# Patient Record
Sex: Male | Born: 1995 | Race: White | Hispanic: No | Marital: Single | Smoking: Current some day smoker
Health system: Southern US, Community
[De-identification: ages and names within clinical notes are randomized; demographics above are authoritative.]

## PROBLEM LIST (undated history)

## (undated) HISTORY — PX: TONSILLECTOMY: SUR1361

---
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
## 2015-03-07 NOTE — H&P (Signed)
PATIENT NAME:  Gabriel Wright, Gabriel Wright MR#:  [id number] DATE OF BIRTH:  [birth year]  DATE OF ADMISSION:  10/04/2014  ADMITTING PHYSICIAN: Enid Baas, MD.    PRIMARY CARE PHYSICIAN: None.   CHIEF COMPLAINT: Difficulty swallowing, sore throat.   HISTORY OF PRESENT ILLNESS: Gabriel Wright is an 19 year old kid with no significant past medical history who presents to the hospital secondary to a 1 day history of fever, severe headache, and also difficulty swallowing. The patient states his symptoms started about 6 days ago and he had low-grade fevers, headaches. Yesterday he went to the health department as he was having a sore throat as well. They checked him out and a mononucleosis test was positive and he was discharged home on prednisone to help with his tonsillar swelling. However, this morning he woke up, had a fever of 101 degrees Fahrenheit,  in the Emergency Room he could not swallow his secretions, his tonsils were extremely big, he was having severe throat pain, he could not take ibuprofen and presented to the ER. CT of the neck showed severe lymphadenopathy, a left tonsillar small abscess which was drained by ENT in the Emergency Room. The patient is being admitted for further observation for IV antibiotics and Decadron to decrease his throat swallowing so he can take medicines by p.o.    PAST MEDICAL HISTORY: None.   PAST SURGICAL HISTORY: None.   ALLERGIES TO MEDICATIONS: No known drug allergies.   CURRENT HOME MEDICATIONS: He is only on prednisone that was started by the health department yesterday, otherwise does not have any prescription or over the counter medications.   SOCIAL HISTORY: Lives at home with parents. No smoking. Occasional alcohol use.   FAMILY HISTORY:  Both parents are healthy. No other significant family history in the family.     REVIEW OF SYSTEMS:  CONSTITUTIONAL: Positive for fatigue, fever.  EYES: No blurred vision, double vision, inflammation or glaucoma.   ENT: No tinnitus, ear pain, hearing loss, epistaxis or discharge. The patient does have some odynophagia and dysphagia. Unable to swallow his secretions from severe throat pain.  RESPIRATORY: No cough, wheeze, hemoptysis or COPD.   CARDIOVASCULAR: No chest pain, arrhythmia, palpitations, or syncope.  GASTROINTESTINAL: No nausea, vomiting, diarrhea, abdominal pain, hematemesis, or melena.  GENITOURINARY: No dysuria, hematochezia, renal calculus, frequency, or incontinence.  ENDOCRINE: No polyuria, nocturia, thyroid problems, heat or cold intolerance.  HEMATOLOGY: No anemia, easy bruising or bleeding.  SKIN: No acne, rash or lesions.  MUSCULOSKELETAL: No neck, back, shoulder pain, arthritis or gout.  NEUROLOGIC: No numbness, weakness, CVA, TIA or seizures.   PSYCHOLOGICAL: No anxiety, insomnia or depression.   PHYSICAL EXAMINATION: VITAL SIGNS: Temperature 101.1 degrees Fahrenheit in the Emergency Room, pulse 114, respirations 18, blood pressure 146/76, pulse oximetry 96% on room air.  GENERAL: Well-built, well-nourished young male sitting in bed, not in any acute distress.  HEENT: Normocephalic, atraumatic. Pupils equal, round, reacting to light. Anicteric sclerae. Extraocular movements intact. There is a birth mark, a big telangiectasia on the left side of the face from the nose onto the cheekbone. Oropharynx: Increased salivation because the patient unable to swallow his secretions completely. Tonsils are very enlarged, kissing tonsils. A little exudate is noted on them but there are erythematous. Small petechiae noted on the palate and had recent drainage in the Emergency Room so a small bruise noted at the site.  NECK: Supple. No thyromegaly, JVD or carotid bruits. No lymphadenopathy.  LUNGS: Moving air bilaterally. No wheeze or crackles. No use of  accessory muscles for breathing.  CARDIOVASCULAR: S1, S2, regular rate and rhythm. No murmurs, rubs, or gallops.  ABDOMEN: Soft, nontender. Mild  discomfort in the left upper quadrant and slightly enlarged spleen noted. Normal bowel sounds.  EXTREMITIES: No pedal edema. No clubbing or cyanosis. With 2+ dorsalis pedis pulses palpable bilaterally.  SKIN: No acne, rash or lesions.  LYMPHATICS: Significantly enlarged posterior cervical lymph nodes and also submandibular lymph nodes on both sides. The left-sided lymph node is much enlarged on the clinical exam.  SKIN: No acne, rash or lesions.  NEUROLOGIC: Cranial nerves intact. No focal motor or sensory deficits. Cerebellar function tests normal.  PSYCHOLOGICAL: The patient is awake, alert, oriented x 3.   LABORATORY DATA: WBC 22.7, hemoglobin 15.4, hematocrit 46.3, platelet count 157,000.   Percentage of lymphocytes is 12% with 42% segmented lymphocytes, monocytes are 14%.   Sodium 135, potassium 3.7, chloride 101, bicarbonate 27, BUN 12, creatinine 1.1, glucose 114, and calcium of 8.9.   CT of the neck with contrast showing moderate prominence of adenoidal and tonsillar tissues with resulting narrowing of nasopharyngeal airway, low density collection in left tonsillar tissue compatible with tonsillar abscess. Moderate bilateral cervical chain adenopathy with largest node over the right cervical chain containing areas of patchy low density agents.   Rapid strep test is negative.   ASSESSMENT AND PLAN: An 19 year old male with no significant medical problems admitted for tonsillitis and peritonsillar abscess.  Acute inflammatory tonsillitis and peritonsillar abscess secondary to infectious mononucleosis. Significantly enlarged tonsils compromising nasopharyngeal airway. The patient unable to handle his own secretions. The peritonsillar abscess was drained by ENT in the emergency room. Will admit for IV fluids, IV antibiotics, and steroids. Start on liquid diet and advance as tolerated. Appreciate ENT consultation at this time. Continue to monitor. If improving and able to eat by mouth, then  discharge tomorrow. He will use Motrin and Tylenol p.r.n. for pain and fever.  TIME SPENT ON ADMISSION: 50 minutes.    ____________________________ Enid Baasadhika Salli Bodin, MD rk:at D: 10/04/2014 13:52:55 ET T: 10/04/2014 15:15:41 ET JOB#: [id number]  cc: Enid Baasadhika Raiquan Chandler, MD, <Dictator> Enid BaasADHIKA Chantille Navarrete MD ELECTRONICALLY SIGNED 10/29/2014 14:48

## 2015-03-07 NOTE — Op Note (Signed)
PATIENT NAME:  Gabriel Wright, Gabriel Wright MR#:  [id number] DATE OF BIRTH:  [birth year]  DATE OF PROCEDURE:  10/04/2014  PREOPERATIVE DIAGNOSIS: Left peritonsillar abscess.   POSTOPERATIVE DIAGNOSIS: Left peritonsillar abscess.   PROCEDURE: Incision and drainage of left peritonsillar abscess.   SURGEON: Ollen Grossaul S. Willeen CassBennett, MD.   ANESTHESIA: Local.   INDICATIONS: The patient with evidence of a left peritonsillar abscess on CT imaging with known mononucleosis.   FINDINGS: 2.5 mL of purulent secretions were aspirated from left peritonsillar space.   COMPLICATIONS: None.   DESCRIPTION OF PROCEDURE: After discussing the procedure with the patient, the throat was initially anesthetized with Hurricaine spray. Lidocaine 1% with epinephrine 1:200,000 was injected into the left peritonsillar region. An 18-gauge needle was then used to aspirate roughly at the mid pole of the tonsil on the left side and 2.5 mL of purulent secretions was aspirated. A 15 blade was used to incise the area and an instrument used to spread and break up any loculations and allow further drainage. No further purulence was encountered. Bleeding was minimal. The patient tolerated the procedure well. The fluid aspirate was sent for aerobic and anaerobic culture.    ____________________________ Ollen GrossPaul S. Willeen CassBennett, MD psb:at D: 10/04/2014 11:04:52 ET T: 10/04/2014 16:25:13 ET JOB#: [id number]  cc: Ollen GrossPaul S. Willeen CassBennett, MD, <Dictator> Sandi MealyPAUL S Jasiah Buntin MD ELECTRONICALLY SIGNED 10/14/2014 7:52

## 2015-03-07 NOTE — Consult Note (Signed)
ENT Consult: successfully drained 2.5cc of purulence from small left peritonsillar abscess. Unless patient improves significantly here in ER now that this has been drained (able to handle secretions and take PO liquids), would recommend medical admission for observation, hydration, and IV Unasyn/Decadron. Patient may be discharged when able to handle PO meds/liquids.  Electronic Signatures: Sandi MealyBennett, Dashauna Heymann S (MD)  (Signed on 21-Nov-15 11:07)  Authored  Last Updated: 21-Nov-15 11:07 by Sandi MealyBennett, Sela Falk S (MD)

## 2015-03-07 NOTE — Consult Note (Signed)
PATIENT NAME:  Melodie BouillonCIESIELSKI, Janelle MR#:  [id number] DATE OF BIRTH:  22-Feb-1996  DATE OF CONSULTATION:  10/04/2014  REFERRING PHYSICIAN:  Evelina DunMelissa Kucera, DC CONSULTING PHYSICIAN:  Ollen Grossaul S. Willeen CassBennett, MD  REASON FOR CONSULTATION: Possible peritonsillar abscess.   HISTORY OF PRESENT ILLNESS: This 19 year old male has been sick with a sore throat for the past 2 days, with associated high fever. He presented to the Emergency Room and was noted to have negative Strep testing with an elevated white count. Apparently, he had been diagnosed with mononucleosis as an outpatient, and was on prednisone. He has not had issues with recurrent tonsillitis. The CT scan of the neck was performed, which showed acute tonsillitis with evidence of a small fluid collection on the left, consistent with a small peritonsillar abscess, 1 x 2.2 cm. He also had bilateral cervical lymphadenopathy with the largest node measuring 3 cm in size. The patient has been given IV Unasyn and dexamethasone, and actually feels a little better, although is still not handling his secretions.   PAST MEDICAL HISTORY: Otherwise unremarkable. No history of asthma, diabetes, or any other ongoing medical problems.   SOCIAL HISTORY: He is a nonsmoker. Drinks alcohol socially. Denies drug use.   MEDICATIONS: Prednisone.   ALLERGIES: None.   REVIEW OF SYSTEMS: The patient has had fever and sore throat, but no difficulty breathing. No chest pain, nausea, vomiting, diarrhea, or rash.   PHYSICAL EXAMINATION:  VITAL SIGNS: Temperature 101.1, heart rate 103, blood pressure 116/62.  GENERAL: Well-developed, well-nourished male, in no acute distress with no stridor, sitting upright in bed. He is suctioning his secretions with a Yankauer suction.  HEAD AND FACE: Head is normocephalic, atraumatic. 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 No other skin lesions are seen. There is no rash.  EARS: External ears are unremarkable. Ear  canals are free of cerumen. Tympanic membranes are clear bilaterally.  NOSE: External nose unremarkable, other than the hemangioma as noted. Nasal cavity is clear with no purulence or polyps.  ORAL CAVITY AND OROPHARYNX:, Teeth, lips, and gums unremarkable. Tongue and floor of mouth without lesions. Posterior pharynx reveals 4+ cryptic tonsils with scant exudate and edema. There is really not any edema of the palate and no significant uvular shift. No obvious abscess by exam.  NECK: The neck is supple, with bulky cervical lymphadenopathy bilaterally, actually more prominent on the right than the left. There is no thyromegaly. Salivary glands are without tenderness or mass.  NEUROLOGIC: Cranial nerves II through XII are grossly intact.   ASSESSMENT: The patient has a small left peritonsillar abscess, although his predominant issue is inflammatory tonsillitis from mononucleosis with associated lymphadenopathy.   PLAN: I was able to drain 2.5 mL of purulence from the left peritonsillar space. This was sent for culture. The patient was feeling somewhat better after steroids and IV antibiotics. Options include further observation in the Emergency Room, to see if he might return to handling his secretions and swallowing fluids now that the fluid collection has been drained, although he may need admission to the medicine service for overnight observation and further IV Unasyn and Decadron until he can return to taking adequate p.o. liquids. No further surgical drainage, however, is necessary as this was a small fluid collection.    ____________________________ Ollen GrossPaul S. Willeen CassBennett, MD psb:MT D: 10/04/2014 11:01:37 ET T: 10/04/2014 11:41:31 ET JOB#: [id number]  cc: Ollen GrossPaul S. Willeen CassBennett, MD, <Dictator> Sandi MealyPAUL S Sanyia Dini MD ELECTRONICALLY SIGNED 10/14/2014 7:52

## 2015-03-07 NOTE — Discharge Summary (Signed)
PATIENT NAME:  Gabriel Wright, Gabriel Wright MR#:  [id number] DATE OF BIRTH:  Apr 13, 1996  DATE OF ADMISSION:  10/04/2014 DATE OF DISCHARGE:  10/05/2014  ADMITTING PHYSICIAN: Enid Baas, MD   DISCHARGING PHYSICIAN: Enid Baas, MD   PRIMARY CARE PHYSICIAN: Nonlocal in [place].   CONSULTATIONS IN THE HOSPITAL: ENT consultation by Ollen Gross. Willeen Cass, MD.  DISCHARGE DIAGNOSES:  1.  Infectious mononucleosis.  2.  Inflammatory tonsillitis.  3.  Left peritonsillar abscess, status post drainage.  4.  Leukocytosis  DISCHARGE MEDICATIONS; 1.  Ibuprofen 400 mg q. 6 hours p.r.n. for pain.  2.  Decadron 4 mg 3 times a day for 5 days, 1 tablet twice a day for 3 days, 1 tablet orally once a day for 3 days and advised to stop.  3.  Augmentin 875 mg 1 tablet p.o. b.i.d. for 8 days.   DISCHARGE DIET: Regular diet.   DISCHARGE ACTIVITY: As tolerated.    FOLLOWUP INSTRUCTIONS:  1.  PCP followup in 2 weeks.  2.  No contact sports for 2 weeks.  LABORATORY AND IMAGING STUDIES PRIOR TO DISCHARGE: WBC 23.1, hemoglobin 13.8, hematocrit 41.9, platelet count 171,000.   Sodium 140, potassium 4.2, chloride 104, bicarbonate 30, BUN 11, creatinine 0.93, glucose 136 and calcium of 8.0. Throat cultures growing group C streptococcus. Blood cultures are negative. CT of the neck with contrast on admission showing small left peritonsillar abscess measuring 1 x 2.2 cm and enlarged adenoid and tonsillar tissues with narrowing of the nasopharyngeal airway. Moderately enlarged bilateral posterior cervical chain adenopathy, especially largest node in the right cervical chain area. Epiglottis and subglottic  airway are normal.   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   Infectious mononucleosis. The patient has been having fevers, headaches and sore throat for almost a week, went to the health  department 1 day prior to admission, had mononucleosis test which confirmed positive; however, his throat pain was getting worse, had a high-grade fever of 102 degrees Fahrenheit at home, difficulty breathing, presented to the hospital. CT of the neck showed narrowing of nasopharyngeal airway, kissing tonsils and small peritonsillar abscess on the left side. He was seen by ENT. The small abscess was drained. The patient was on broad-spectrum antibiotics in the hospital with vancomycin and Unasyn and also Decadron to help decrease the swelling. When he got admitted he was unable to even handle his own oral secretions. Ibuprofen was added for pain along with morphine. His swelling has slightly gone down at the time of discharge, and the patient will need oral antibiotics, is being changed over to oral Decadron and oral Augmentin at this time. Liquid diet was started, in the hospital, once he was able to handle his oral secretions. He was reassured that if it is mononucleosis related inflammatory tonsillitis and it will improve within 1-2 weeks. The patient flew [place] and will follow up with his primary care physician over there. Advised not to do any contact sports at least for 2-3 weeks because he did have some mild to moderate splenomegaly. He cultures otherwise came back negative from the blood and throat cultures were positive for group C Streptococcus. His course has been otherwise uneventful in the hospital.   DISCHARGE CONDITION: Stable.   DISCHARGE DISPOSITION: Home.   TIME SPENT ON DISCHARGE: Forty minutes.     ____________________________ Enid Baas, MD rk:TT D: 10/06/2014 16:55:25 ET T: 10/06/2014 20:22:49 ET JOB#: [id number]  cc: Enid Baasadhika Denard Tuminello, MD, <Dictator> Enid BaasADHIKA Darnetta Kesselman MD ELECTRONICALLY SIGNED 10/31/2014 14:00

## 2016-06-26 IMAGING — CT CT NECK WITH CONTRAST
3 of 5 series · 12 of 33 positions shown, 14 images · IV contrast (agent unspecified)
Comparison: None.

CLINICAL DATA: Pt diagnosed 2 days ago with mono, woke up this am
with swollen/sore throat, took 3 Advil at 4544, and having no
relief. Lymph nodes obviously swollen. Pt denies hx of surgery to
neck or hx of ca.

EXAM:
CT NECK WITH CONTRAST
TECHNIQUE: Multidetector CT imaging of the neck was performed using the
standard protocol following the bolus administration of intravenous
contrast.
CONTRAST:  75 mL [id number] IV

[Series 5: sag neck · sagittal · 0.44mm/px · 5 of 129 slices shown, 6 images]
[im 43/129  bone]
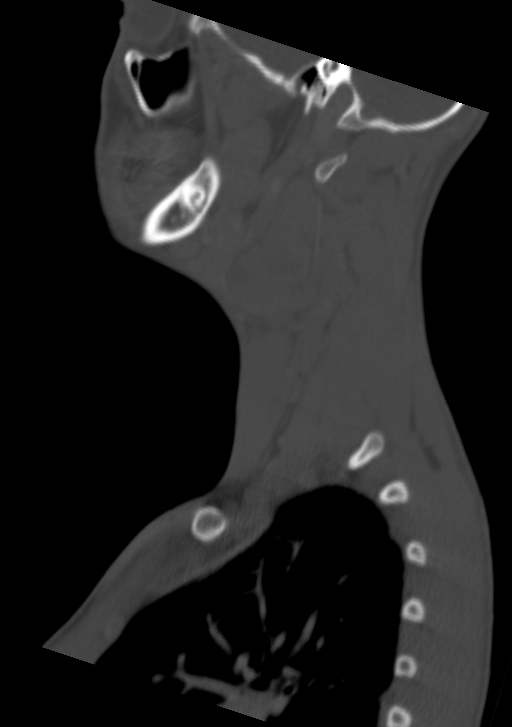
[im 54/129  bone]
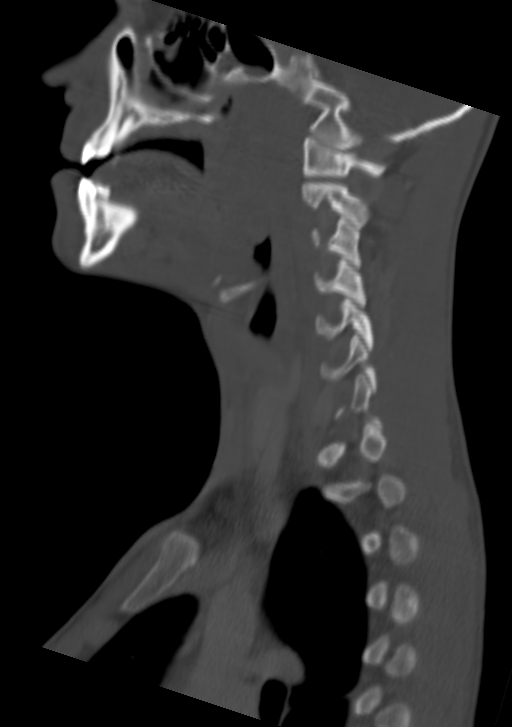
[im 65/129  soft-tissue]
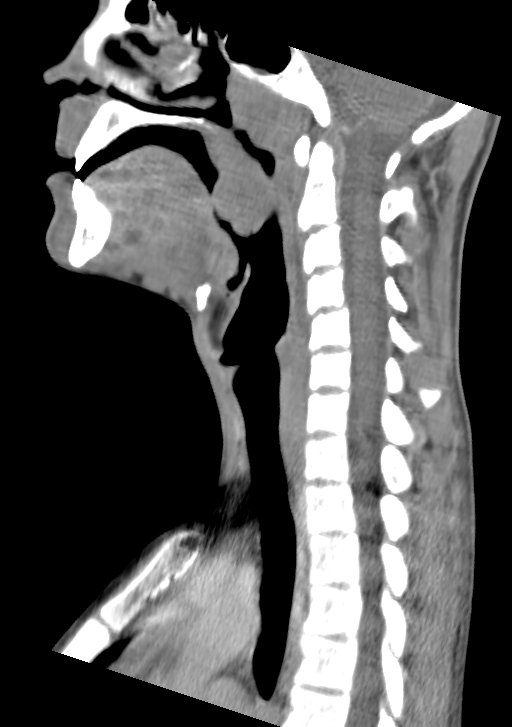
[im 65/129  bone]
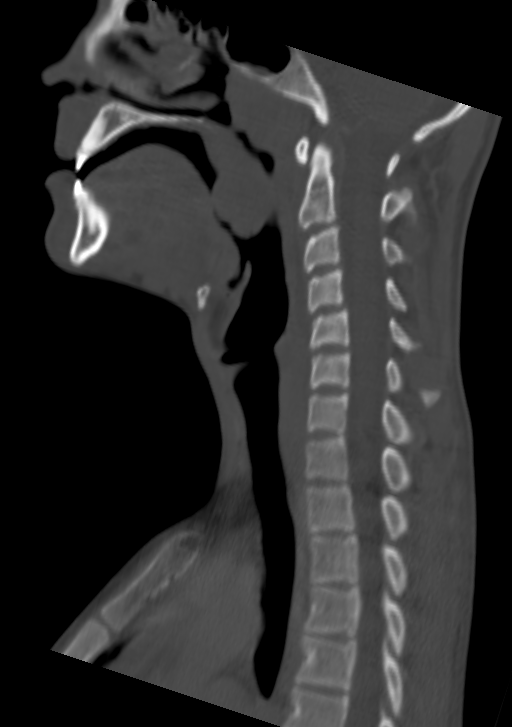
[im 75/129  bone]
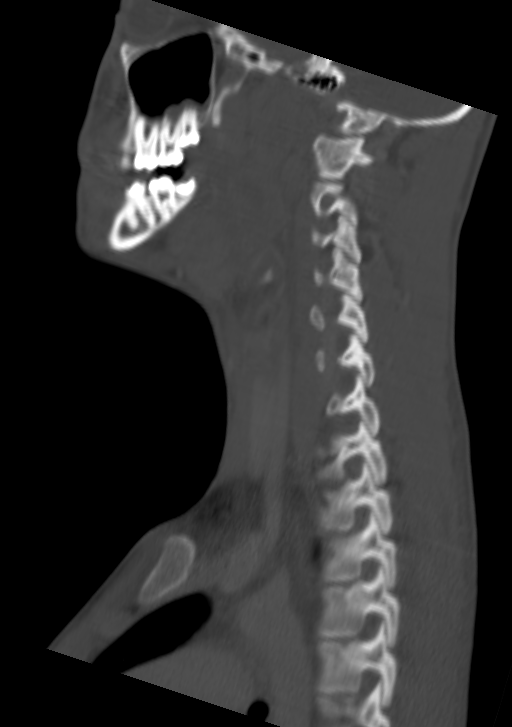
[im 86/129  bone]
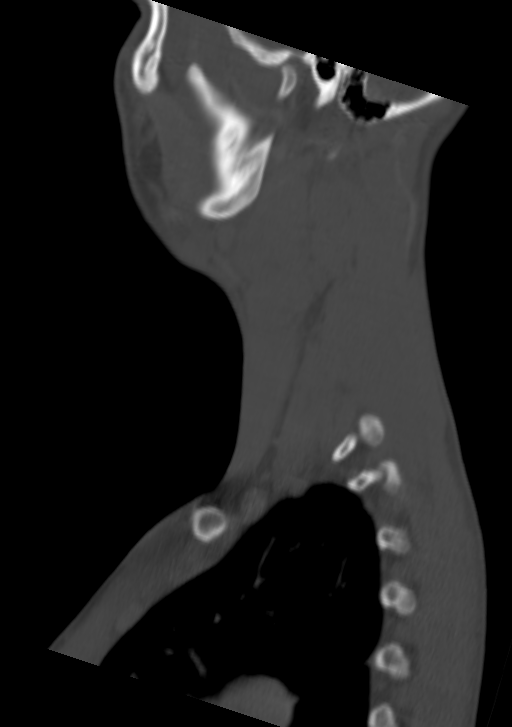

[Series 6: cor neck · coronal · 0.64mm/px · 3 of 89 slices shown]
[im 22/89  bone]
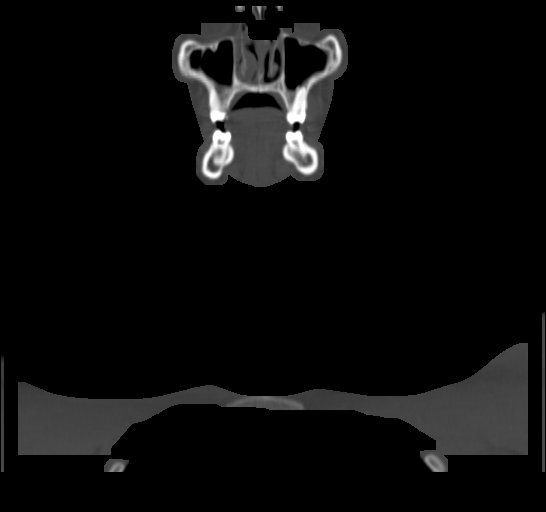
[im 37/89  bone]
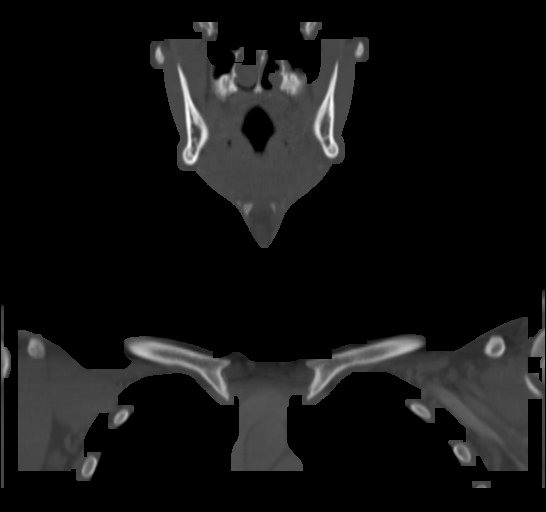
[im 52/89  bone]
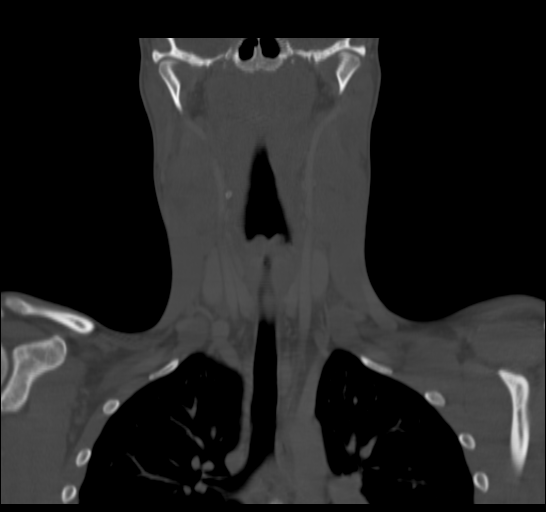

[Series 7: ax oropharynx · axial · 0.40mm/px · z∈[+920,+1093]mm · 4 of 150 slices shown, 5 images]
[im 30/150  soft-tissue]
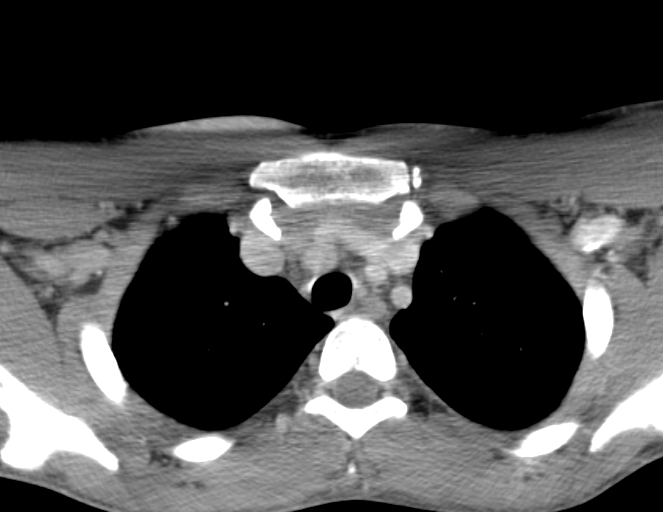
[im 30/150  bone]
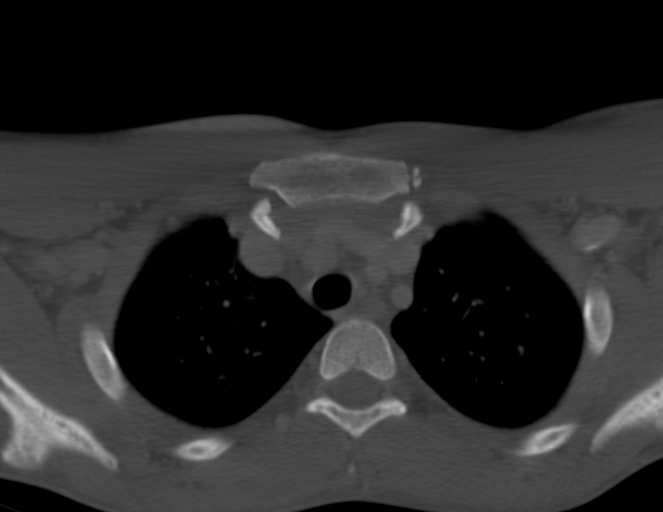
[im 60/150  bone]
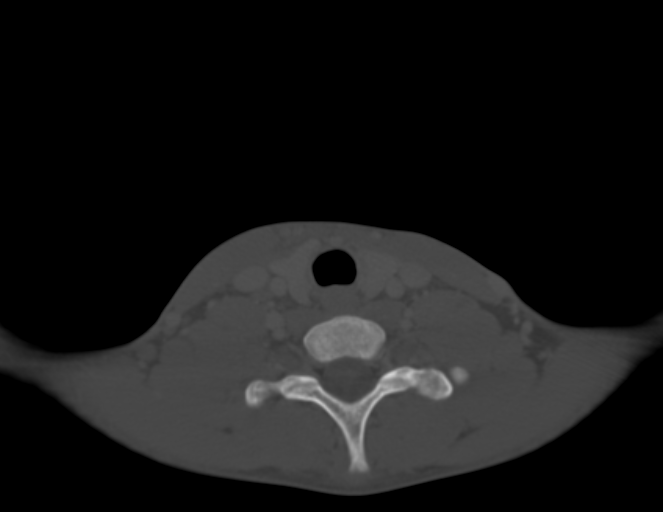
[im 90/150  bone]
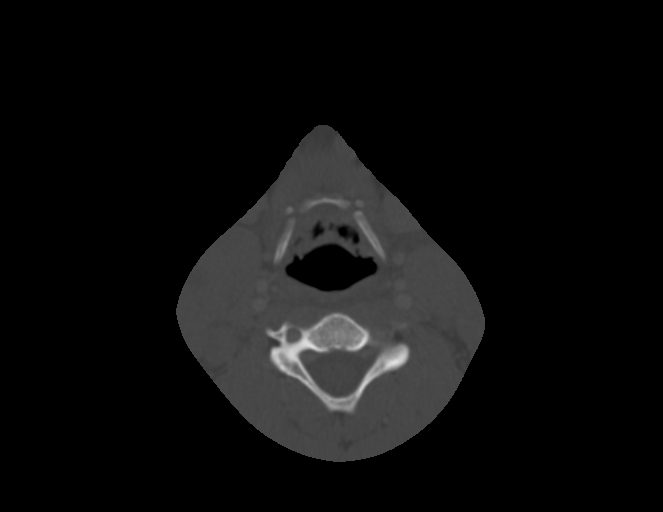
[im 120/150  bone]
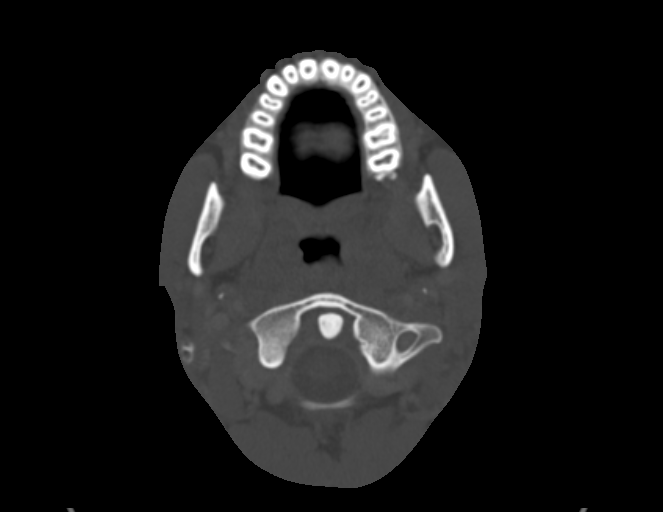

[12 of 33 positions shown; findings below may reference images not displayed]

FINDINGS: Examination demonstrates the visualized orbits to be normal
symmetric. There is minimal chronic sinus inflammatory disease.

There is moderate symmetric prominence of the adenoidal tissue and
palatine tonsillar tissues. There is a focal fairly well-defined
oval low-density collection over the left tonsillar tissues
measuring 1 x 2.2 cm in transverse in AP dimension. There is mild
narrowing of the nasopharyngeal airway. There is associated
bilateral cervical chain adenopathy with multiple nodes measuring 2
cm and greater by short axis. The largest node is over the right
cervical chain measuring 3 cm by short axis containing patchy areas
of low density. Epiglottis and subglottic airway are normal. The

Remainder of the infrahyoid neck is notable 1 4 mild adenopathy.
Visualized lungs and upper mediastinum are within normal. Remainder
of the exam is remarkable.
IMPRESSION: Moderate prominence of the adenoidal and tonsillar tissues with
minimal resulting narrowing of the nasopharyngeal airway. There is a
well-defined low-density collection within the left tonsillar
tissues measuring 1 x 2.2 cm compatible with a tonsillar abscess.
There is moderate associated bilateral cervical chain adenopathy
with the largest node over the right cervical chain containing areas
of patchy low density and measuring 3 cm by short axis.

These results were called by telephone at the time of interpretation
on 10/04/2014 at [DATE] to Dr. JAN KURT TON , who verbally
acknowledged these results.

## 2017-09-02 ENCOUNTER — Encounter: Payer: Self-pay | Admitting: Emergency Medicine

## 2017-09-02 ENCOUNTER — Emergency Department
Admission: EM | Admit: 2017-09-02 | Discharge: 2017-09-03 | Disposition: A | Discharge status: Home / Self Care | Payer: PRIVATE HEALTH INSURANCE | Attending: Emergency Medicine | Admitting: Emergency Medicine

## 2017-09-02 DIAGNOSIS — R45851 Suicidal ideations: Secondary | ICD-10-CM | POA: Diagnosis not present

## 2017-09-02 DIAGNOSIS — X781XXA Intentional self-harm by knife, initial encounter: Secondary | ICD-10-CM | POA: Insufficient documentation

## 2017-09-02 DIAGNOSIS — F339 Major depressive disorder, recurrent, unspecified: Secondary | ICD-10-CM | POA: Diagnosis present

## 2017-09-02 DIAGNOSIS — F17290 Nicotine dependence, other tobacco product, uncomplicated: Secondary | ICD-10-CM | POA: Diagnosis not present

## 2017-09-02 LAB — URINE DRUG SCREEN, QUALITATIVE (ARMC ONLY)
Amphetamines, Ur Screen: NOT DETECTED
Barbiturates, Ur Screen: NOT DETECTED
Benzodiazepine, Ur Scrn: NOT DETECTED
Cannabinoid 50 Ng, Ur ~~LOC~~: NOT DETECTED
Cocaine Metabolite,Ur ~~LOC~~: NOT DETECTED
MDMA (ECSTASY) UR SCREEN: NOT DETECTED
Methadone Scn, Ur: NOT DETECTED
OPIATE, UR SCREEN: NOT DETECTED
PHENCYCLIDINE (PCP) UR S: NOT DETECTED
Tricyclic, Ur Screen: NOT DETECTED

## 2017-09-02 LAB — COMPREHENSIVE METABOLIC PANEL
ALBUMIN: 4.6 g/dL (ref 3.5–5.0)
ALK PHOS: 66 U/L (ref 38–126)
ALT: 25 U/L (ref 17–63)
AST: 44 U/L — AB (ref 15–41)
Anion gap: 13 (ref 5–15)
BILIRUBIN TOTAL: 0.7 mg/dL (ref 0.3–1.2)
BUN: 8 mg/dL (ref 6–20)
CO2: 26 mmol/L (ref 22–32)
CREATININE: 1.14 mg/dL (ref 0.61–1.24)
Calcium: 9 mg/dL (ref 8.9–10.3)
Chloride: 102 mmol/L (ref 101–111)
GFR calc Af Amer: >60 mL/min (ref >60)
GFR calc non Af Amer: >60 mL/min (ref >60)
GLUCOSE: 100 mg/dL — AB (ref 65–99)
Potassium: 3.9 mmol/L (ref 3.5–5.1)
SODIUM: 141 mmol/L (ref 135–145)
TOTAL PROTEIN: 7.6 g/dL (ref 6.5–8.1)

## 2017-09-02 LAB — CBC
HEMATOCRIT: 44.8 % (ref 40.0–52.0)
Hemoglobin: 15.2 g/dL (ref 13.0–18.0)
MCH: 29.3 pg (ref 26.0–34.0)
MCHC: 33.9 g/dL (ref 32.0–36.0)
MCV: 86.4 fL (ref 80.0–100.0)
Platelets: 209 10*3/uL (ref 150–440)
RBC: 5.19 MIL/uL (ref 4.40–5.90)
RDW: 13.9 % (ref 11.5–14.5)
WBC: 8.3 10*3/uL (ref 3.8–10.6)

## 2017-09-02 LAB — ACETAMINOPHEN LEVEL: Acetaminophen (Tylenol), Serum: <10 ug/mL — ABNORMAL LOW (ref 10–30)

## 2017-09-02 LAB — SALICYLATE LEVEL: Salicylate Lvl: <7 mg/dL (ref 2.8–30.0)

## 2017-09-02 LAB — ETHANOL: Alcohol, Ethyl (B): 221 mg/dL — ABNORMAL HIGH (ref <10)

## 2017-09-02 NOTE — ED Notes (Signed)
[facility]  [phone]

## 2017-09-02 NOTE — ED Notes (Signed)
Superficial lacerations noted to right wrist.

## 2017-09-02 NOTE — Discharge Instructions (Signed)
You have been seen in the Emergency Department (ED)  today for a psychiatric complaint.  You have been evaluated by psychiatry and we believe you are safe to be discharged from the hospital.   ° °Please return to the Emergency Department (ED)  immediately if you have ANY thoughts of hurting yourself or anyone else, so that we may help you. ° °Please avoid alcohol and drug use. ° °Follow up with your doctor and/or therapist as soon as possible regarding today's ED  visit.  ° °You may call crisis hotline for [place] at [phone]. ° °

## 2017-09-02 NOTE — [facility] Counselor (Signed)
Spoke with patient about counseling.  Pt stated he wants to get counseling.  He reported he has used [facility] in the past and liked using them.  The pt was also given information on out patient counselors.  He was unsure of his insurance, but was given information about how to find a counselor that takes his insurance.

## 2017-09-02 NOTE — ED Provider Notes (Signed)
[facility] Emergency Department Provider Note  ____________________________________________  Time seen: Approximately 4:21 PM  I have reviewed the triage vital signs and the nursing notes.   HISTORY  Chief Complaint Suicidal   HPI Gabriel Wright is a 21 y.o. male no significant past medical history who presents via EMS for depression and suicidal thoughts. Patient reports that he has had these feelings and thoughts for several years. 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 He reports that he has suicidal thoughts on a weekly basis including current thoughts today. He also has self inflicted cuts in his wrists which he did yesterday. Tetanus shot is up-to-date.he denies any prior history of cutting. He denies any active plan. He reports that his parents do not know about this. He has never been diagnosed with depression or started on medicine. He is not her who called the police and thinks it might have been somebody from his fraternity. He denies any medical complaints.endorses that he smokes marijuana but denies any other drugs. Drinks alcohol socially, smokes cigarettes.  History reviewed. No pertinent past medical history.  There are no active problems to display for this patient.   Past Surgical History:  Procedure Laterality Date  . TONSILLECTOMY      Prior to Admission medications   Not on File    Allergies Patient has no known allergies.  No family history on file.  Social History Social History  Substance Use Topics  . Smoking status: Current Some Day Smoker    Types: E-cigarettes  . Smokeless tobacco: Never Used  . Alcohol use Yes     Comment: "3 days a week"    Review of Systems  Constitutional: Negative for fever. Eyes: Negative for visual changes. ENT: Negative for sore throat. Neck: No neck pain  Cardiovascular: Negative for chest  pain. Respiratory: Negative for shortness of breath. Gastrointestinal: Negative for abdominal pain, vomiting or diarrhea. Genitourinary: Negative for dysuria. Musculoskeletal: Negative for back pain. Skin: Negative for rash. Neurological: Negative for headaches, weakness or numbness. Psych: + depression and SI. No HI  ____________________________________________   PHYSICAL EXAM:  VITAL SIGNS: ED Triage Vitals [09/02/17 1514]  Enc Vitals Group     BP 137/75     Pulse Rate 82     Resp 14     Temp 98.2 F (36.8 C)     Temp Source Oral     SpO2 96 %     Weight 192 lb (87.1 kg)     Height 6\' 1"  (1.854 m)     Head Circumference      Peak Flow      Pain Score      Pain Loc      Pain Edu?      Excl. in GC?     Constitutional: Alert and oriented. Well appearing and in no apparent distress. HEENT:      Head: Normocephalic and atraumatic.         Eyes: Conjunctivae are normal. Sclera is non-icteric.       Mouth/Throat: Mucous membranes are moist.       Neck: Supple with no signs of meningismus. Cardiovascular: Regular rate and rhythm. No murmurs, gallops, or rubs. 2+ symmetrical distal pulses are present in all extremities. No JVD. Respiratory: Normal respiratory effort. Lungs are clear to auscultation bilaterally. No wheezes, crackles, or rhonchi.  Gastrointestinal: Soft, non tender, and non  distended with positive bowel sounds. No rebound or guarding. Musculoskeletal: Nontender with normal range of motion in all extremities. No edema, cyanosis, or erythema of extremities. Neurologic: Normal speech and language. Face is symmetric. Moving all extremities. No gross focal neurologic deficits are appreciated. Skin: Superficial linear lacerations on bilateral wrists Psychiatric: Mood and affect are blunt. Active SI with no plan. Speech and behavior are normal.  ____________________________________________   LABS (all labs ordered are listed, but only abnormal results are  displayed)  Labs Reviewed  COMPREHENSIVE METABOLIC PANEL - Abnormal; Notable for the following:       Result Value   Glucose, Bld 100 (*)    AST 44 (*)    All other components within normal limits  ETHANOL - Abnormal; Notable for the following:    Alcohol, Ethyl (B) 221 (*)    All other components within normal limits  ACETAMINOPHEN LEVEL - Abnormal; Notable for the following:    Acetaminophen (Tylenol), Serum <10 (*)    All other components within normal limits  SALICYLATE LEVEL  CBC  URINE DRUG SCREEN, QUALITATIVE ([facility] ONLY)   ____________________________________________  EKG  none  ____________________________________________  RADIOLOGY   none ____________________________________________   PROCEDURES  Procedure(s) performed: None Procedures Critical Care performed:  None ____________________________________________   INITIAL IMPRESSION / ASSESSMENT AND PLAN / ED COURSE  21 y.o. male no significant past medical history who presents via EMS for depression and suicidal thoughts x several years. Current SI with no plan. Patient seems very depressed. Here voluntarily. Will consult psychiatry. Medical clearance labs pending.    _________________________ 9:47 PM on 09/02/2017 -----------------------------------------  labs showing a alcohol level of 221 otherwise unremarkable. Patient was evaluated by psychiatry who recommended outpatient management. No medications were initiated by psychiatrist at this time. Patient's mother is currently in a plane from [place] coming to spend time with patient. Patient will be discharged home. Provided with outpatient resources.   As part of my medical decision making, I reviewed the following data within the electronic MEDICAL RECORD NUMBER Nursing notes reviewed and incorporated, Labs reviewed , A consult was requested and obtained from this/these consultant(s) Psychiatry, Notes from prior ED visits and [place] Controlled Substance  Database    Pertinent labs & imaging results that were available during my care of the patient were reviewed by me and considered in my medical decision making (see chart for details).    ____________________________________________   FINAL CLINICAL IMPRESSION(S) / ED DIAGNOSES  Final diagnoses:  Episode of recurrent major depressive disorder, unspecified depression episode severity (HCC)      NEW MEDICATIONS STARTED DURING THIS VISIT:  New Prescriptions   No medications on file     Note:  This document was prepared using Dragon voice recognition software and may include unintentional dictation errors.    Nita SickleVeronese, [place], MD 09/02/17 [phone]

## 2017-09-02 NOTE — ED Notes (Signed)
SOC camera set up in room for evaluation.

## 2017-09-02 NOTE — ED Triage Notes (Addendum)
Patient presents to ED via [facility]. 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 Patient states, "I am not sure who called. It must of been one of my fraternity brothers". Patient presents with SI, denies HI or any certain plan on how he would hurt himself. Patient states, "nothing happened today. I have felt like this for years". Calm, cooperative and makes eye contact.

## 2017-09-02 NOTE — ED Notes (Signed)
Pt speaking with mother on telephone

## 2017-09-02 NOTE — BH Assessment (Signed)
Assessment Note  Gabriel Wright is an 21 y.o. male who presents to the ER via EMS due to someone calling because they were concerned about the patient. Patient has superficial cuts on both wrist. Per the patient, he wasn't trying to end his life but was "trying to feel something. I'm numb." On last night (09/01/2017) he drank an unknown amount of alcohol and did not wake up in time. 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  His ex-girlfriend sorority was hosting it. She came by the home to check on him and he's unsure if she's the one who contacted 911. He lives in the home with two other males. All are college students and he states they may have called.  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 When he became a freshman in college, "that's when I start noticing a change (in mood)." Patient started having panic attacks, crying spells and isolating. He further states, he doesn't talk much about what he is feeling because he do not want to "burden other people with my problems." He admits to alcohol, cannabis and cocaine use. His alcohol & cannabis use is more frequent than cocaine. Upon arrival to the ER his BAC was 221. There are several stressors he was able to identify. He's in the process of interviewing for a jobs, he's graduating soon.  He reports feeling pressured by his mother to become a successful "businessman." His major is finances and his siblings are not interested in business and mother keep telling him he's "the one who is going to be successful." He ended the relationship with his girlfriend approximately two weeks ago because that is what he thought he wanted. However, he currently states he's unsure if that's what he "really wanted."  During the interview, the patient was calm, cooperative and pleasant. He was able to provide appropriate answers to the questions. Throughout the interview, he denied SI/HI and  AV/H. Symptoms of depression include; isolating, crying spells, hopelessness and anxiety.    Diagnosis: Depression  Past Medical History: History reviewed. No pertinent past medical history.  Past Surgical History:  Procedure Laterality Date  . TONSILLECTOMY      Family History: No family history on file.  Social History:  reports that he has been smoking E-cigarettes.  He has never used smokeless tobacco. He reports that he drinks alcohol. He reports that he uses drugs, including Marijuana.  Additional Social History:  Alcohol / Drug Use Pain Medications: See PTA Prescriptions: See PTA Over the Counter: See PTA History of alcohol / drug use?: Yes Longest period of sobriety (when/how long): Cocaine Negative Consequences of Use:  (Reports of none) Withdrawal Symptoms:  (Reports of none) Substance #1 Name of Substance 1: Alcohol 1 - Age of First Use: 17 1 - Amount (size/oz): "Until I get drunk. Or unitl pass out or black out. (Unable to quantify) 1 - Frequency: "Three to four times a week" 1 - Duration: Unable to quantify 1 - Last Use / Amount: 09/02/2017 Substance #2 Name of Substance 2: Cannabis 2 - Age of First Use: 18 2 - Amount (size/oz): "A few puff" 2 - Frequency: "One time a week" 2 - Duration: Unable to quantify 2 - Last Use / Amount: 08/31/2017 Substance #3 Name of Substance 3: Cocaine 3 - Age of First Use: 20 3 - Amount (size/oz): Unable to quantify 3 - Frequency: Used five times within lifetime 3 -  Duration: Used five times within lifetime since the age of 21 3 - Last Use / Amount: 09/01/2017  CIWA: CIWA-Ar BP: 137/75 Pulse Rate: 82 COWS:    Allergies: No Known Allergies  Home Medications:  (Not in a hospital admission)  OB/GYN Status:  No LMP for male patient.  General Assessment Data Assessment unable to be completed: Yes [facility] TTS Assessment: In system Is this a Tele or Face-to-Face Assessment?: Face-to-Face Is this  an Initial Assessment or a Re-assessment for this encounter?: Initial Assessment Marital status: Single Maiden name: n/a Is patient pregnant?: No Living Arrangements: Other (Comment) (Roommates in college) Can pt return to current living arrangement?: Yes Admission Status: Voluntary Is patient capable of signing voluntary admission?: Yes Referral Source: Self/Family/Friend Insurance type: Unknown  Medical Screening Exam [facility] Walk-in ONLY) Medical Exam completed: Yes  Crisis Care Plan Living Arrangements: Other (Comment) (Roommates in college) Legal Guardian: Other: (Self) Name of Psychiatrist: Reports of none Name of Therapist: [facility]  Education Status Is patient currently in school?: Yes Current Grade: Senior in [employer] Highest grade of school patient has completed: [employer] Name of school: [employer] Contact person: n/a  Risk to self with the past 6 months Suicidal Ideation: No-Not Currently/Within Last 6 Months Has patient been a risk to self within the past 6 months prior to admission? : Yes Suicidal Intent: No Has patient had any suicidal intent within the past 6 months prior to admission? : No Is patient at risk for suicide?: Yes Suicidal Plan?: No Has patient had any suicidal plan within the past 6 months prior to admission? : No Access to Means: Yes Specify Access to Suicidal Means: Medications & Sharp Objects What has been your use of drugs/alcohol within the last 12 months?: Alcohol, Cannabis & Cocaine Previous Attempts/Gestures: No How many times?: 0 Other Self Harm Risks: Active Addiction Triggers for Past Attempts: None known Intentional Self Injurious Behavior: None Family Suicide History: No Recent stressful life event(s): Other (Comment) (Stress from School, parents' divorce ) Persecutory voices/beliefs?: No Depression: Yes Depression Symptoms: Insomnia, Tearfulness, Isolating, Fatigue, Guilt, Loss of interest in usual  pleasures, Feeling worthless/self pity Substance abuse history and/or treatment for substance abuse?: Yes Suicide prevention information given to non-admitted patients: Not applicable  Risk to Others within the past 6 months Homicidal Ideation: No Does patient have any lifetime risk of violence toward others beyond the six months prior to admission? : No Thoughts of Harm to Others: No Current Homicidal Intent: No Current Homicidal Plan: No Access to Homicidal Means: No Identified Victim: Reports of none History of harm to others?: No Assessment of Violence: None Noted Violent Behavior Description: Reports of none Does patient have access to weapons?: No Criminal Charges Pending?: No Does patient have a court date: No Is patient on probation?: No  Psychosis Hallucinations: None noted Delusions: None noted  Mental Status Report Appearance/Hygiene: Unremarkable, In scrubs Eye Contact: Good Motor Activity: Freedom of movement, Unremarkable Speech: Logical/coherent Level of Consciousness: Alert Mood: Depressed, Anxious, Sad, Pleasant Affect: Appropriate to circumstance, Depressed, Anxious, Sad Anxiety Level: Minimal Thought Processes: Coherent, Relevant Judgement: Partial Orientation: Person, Place, Time, Situation, Appropriate for developmental age Obsessive Compulsive Thoughts/Behaviors: Minimal  Cognitive Functioning Concentration: Normal Memory: Recent Intact, Remote Intact IQ: Average Insight: Fair Impulse Control: Fair Appetite: Good Weight Loss: 0 Weight Gain: 0 Sleep: No Change (Ongoing problems with sleep) Total Hours of Sleep: 6 (Trouble falling and staying asleep) Vegetative Symptoms: None  ADLScreening [facility] Assessment  Services) Patient's cognitive ability adequate to safely complete daily activities?: Yes Patient able to express need for assistance with ADLs?: Yes Independently performs ADLs?: Yes (appropriate for developmental age)  Prior Inpatient  Therapy Prior Inpatient Therapy: No Prior Therapy Dates: Reports of none Prior Therapy Facilty/Provider(s): Reports of none Reason for Treatment: Reports of none  Prior Outpatient Therapy Prior Outpatient Therapy: Yes Prior Therapy Dates: Current Prior Therapy Facilty/Provider(s): [facility] Reason for Treatment: Depression & Anxiety Does patient have an ACCT team?: No Does patient have Intensive In-House Services?  : No Does patient have [facility] services? : No Does patient have P4CC services?: No  ADL Screening (condition at time of admission) Patient's cognitive ability adequate to safely complete daily activities?: Yes Is the patient deaf or have difficulty hearing?: No Does the patient have difficulty seeing, even when wearing glasses/contacts?: No Does the patient have difficulty concentrating, remembering, or making decisions?: No Patient able to express need for assistance with ADLs?: Yes Does the patient have difficulty dressing or bathing?: No Independently performs ADLs?: Yes (appropriate for developmental age) Does the patient have difficulty walking or climbing stairs?: No Weakness of Legs: None Weakness of Arms/Hands: None  Home Assistive Devices/Equipment Home Assistive Devices/Equipment: None  Therapy Consults (therapy consults require a physician order) PT Evaluation Needed: No OT Evalulation Needed: No SLP Evaluation Needed: No Abuse/Neglect Assessment (Assessment to be complete while patient is alone) Physical Abuse: Denies Verbal Abuse: Denies Sexual Abuse: Denies Exploitation of patient/patient's resources: Denies Self-Neglect: Denies Values / Beliefs Cultural Requests During Hospitalization: None Spiritual Requests During Hospitalization: None Consults Spiritual Care Consult Needed: No Social Work Consult Needed: No Merchant navy officer (For Healthcare) Does Patient Have a Medical Advance Directive?: No    Additional  Information 1:1 In Past 12 Months?: No CIRT Risk: No Elopement Risk: No Does patient have medical clearance?: Yes  Child/Adolescent Assessment Running Away Risk: Denies (Patient is an adult)  Disposition:  Disposition Initial Assessment Completed for this Encounter: Yes Disposition of Patient: Pending Review with psychiatrist  On Site Evaluation by:   Reviewed with Physician:    Lilyan Gilford MS, LCAS, LPC, NCC, CCSI Therapeutic Triage Specialist 09/02/2017 6:21 PM

## 2017-09-02 NOTE — ED Notes (Signed)
Soc complete, psychiatrist to fax recommendations to ed md. Pt updated on treatment plan.

## 2017-09-02 NOTE — ED Notes (Signed)
Pt wishes to wait for mother to come and pick him up. Pt's mother is currently flying down to see pt. md advised of request. TTS notified for outpatient psych resources information needed.

## 2017-09-02 NOTE — ED Notes (Signed)
Pt speaking with mother on phone.

## 2017-09-02 NOTE — ED Notes (Signed)
Called SOC for consult  [phone] 

## 2017-12-23 ENCOUNTER — Encounter: Payer: Self-pay | Admitting: Emergency Medicine

## 2017-12-23 ENCOUNTER — Other Ambulatory Visit: Payer: Self-pay

## 2017-12-23 ENCOUNTER — Emergency Department
Admission: EM | Admit: 2017-12-23 | Discharge: 2017-12-23 | Disposition: A | Discharge status: Home / Self Care | Payer: 59 | Attending: Emergency Medicine | Admitting: Emergency Medicine

## 2017-12-23 ENCOUNTER — Emergency Department: Payer: 59

## 2017-12-23 DIAGNOSIS — Y939 Activity, unspecified: Secondary | ICD-10-CM | POA: Diagnosis not present

## 2017-12-23 DIAGNOSIS — Y999 Unspecified external cause status: Secondary | ICD-10-CM | POA: Diagnosis not present

## 2017-12-23 DIAGNOSIS — S60221A Contusion of right hand, initial encounter: Secondary | ICD-10-CM | POA: Diagnosis not present

## 2017-12-23 DIAGNOSIS — Y929 Unspecified place or not applicable: Secondary | ICD-10-CM | POA: Insufficient documentation

## 2017-12-23 DIAGNOSIS — S6991XA Unspecified injury of right wrist, hand and finger(s), initial encounter: Secondary | ICD-10-CM | POA: Diagnosis present

## 2017-12-23 DIAGNOSIS — R1110 Vomiting, unspecified: Secondary | ICD-10-CM | POA: Insufficient documentation

## 2017-12-23 DIAGNOSIS — F17290 Nicotine dependence, other tobacco product, uncomplicated: Secondary | ICD-10-CM | POA: Diagnosis not present

## 2017-12-23 DIAGNOSIS — F10929 Alcohol use, unspecified with intoxication, unspecified: Secondary | ICD-10-CM

## 2017-12-23 DIAGNOSIS — W2209XA Striking against other stationary object, initial encounter: Secondary | ICD-10-CM | POA: Insufficient documentation

## 2017-12-23 LAB — CBC
HCT: 40.6 % (ref 40.0–52.0)
HEMOGLOBIN: 13.6 g/dL (ref 13.0–18.0)
MCH: 28.8 pg (ref 26.0–34.0)
MCHC: 33.5 g/dL (ref 32.0–36.0)
MCV: 86.1 fL (ref 80.0–100.0)
Platelets: 203 10*3/uL (ref 150–440)
RBC: 4.71 MIL/uL (ref 4.40–5.90)
RDW: 14 % (ref 11.5–14.5)
WBC: 8.9 10*3/uL (ref 3.8–10.6)

## 2017-12-23 LAB — COMPREHENSIVE METABOLIC PANEL
ALK PHOS: 83 U/L (ref 38–126)
ALT: 31 U/L (ref 17–63)
ANION GAP: 13 (ref 5–15)
AST: 45 U/L — ABNORMAL HIGH (ref 15–41)
Albumin: 4.6 g/dL (ref 3.5–5.0)
BUN: 15 mg/dL (ref 6–20)
CO2: 23 mmol/L (ref 22–32)
Calcium: 8.7 mg/dL — ABNORMAL LOW (ref 8.9–10.3)
Chloride: 105 mmol/L (ref 101–111)
Creatinine, Ser: 1.01 mg/dL (ref 0.61–1.24)
GFR calc Af Amer: >60 mL/min (ref >60)
GFR calc non Af Amer: >60 mL/min (ref >60)
GLUCOSE: 118 mg/dL — AB (ref 65–99)
POTASSIUM: 3.9 mmol/L (ref 3.5–5.1)
SODIUM: 141 mmol/L (ref 135–145)
Total Bilirubin: 0.7 mg/dL (ref 0.3–1.2)
Total Protein: 7.7 g/dL (ref 6.5–8.1)

## 2017-12-23 LAB — ETHANOL: ALCOHOL ETHYL (B): 327 mg/dL — AB (ref <10)

## 2017-12-23 MED ORDER — ONDANSETRON HCL 4 MG/2ML IJ SOLN
4.0000 mg | INTRAMUSCULAR | Status: AC
  Administered 2017-12-23: 4 mg via INTRAVENOUS
  Filled 2017-12-23: qty 2

## 2017-12-23 NOTE — Discharge Instructions (Signed)
You were seen in the emergency department for alcohol intoxication.  You were so intoxicated that we almost had to intubate you (put a tube down your throat and into your lungs) and put you on a ventilator for your protect.  Please do not binge drink in the future or you may end up in the ICU or dead.

## 2017-12-23 NOTE — ED Provider Notes (Signed)
[facility] Emergency Department Provider Note  ____________________________________________   First MD Initiated Contact with Patient 12/23/17 0235     (approximate)  I have reviewed the triage vital signs and the nursing notes.   HISTORY  Chief Complaint Alcohol Intoxication  Level 5 caveat:  history/ROS limited by acute intoxication  HPI Gabriel Wright is a 22 y.o. male with no known past medical history who presents by EMS for evaluation due to alcohol intoxication.  Very little history provided but apparently the patient is a [employer] student and has been drinking heavily tonight.  At some point he became angry and punched a wall and he has some superficial lacerations and swelling and redness of his right hand but no gross deformities.  He is sleeping soundly at this time but there is no report of any other trauma or injury.  No other history of other types of intoxication.  The patient has been vomiting on himself.   History reviewed. No pertinent past medical history.  There are no active problems to display for this patient.   Past Surgical History:  Procedure Laterality Date  . TONSILLECTOMY      Prior to Admission medications   Not on File    Allergies Patient has no known allergies.  No family history on file.  Social History Social History   Tobacco Use  . Smoking status: Current Some Day Smoker    Types: E-cigarettes  . Smokeless tobacco: Never Used  Substance Use Topics  . Alcohol use: Yes    Comment: "3 days a week"  . Drug use: Yes    Types: Marijuana    Review of Systems Level 5 caveat:  history/ROS limited by acute intoxication ____________________________________________   PHYSICAL EXAM:  VITAL SIGNS: ED Triage Vitals  Enc Vitals Group     BP 12/23/17 0222 (!) 123/58     Pulse Rate 12/23/17 0215 63     Resp 12/23/17 0222 (!) 22     Temp 12/23/17 0222 (!) 97 F (36.1 C)     Temp Source 12/23/17  0222 Axillary     SpO2 12/23/17 0222 96 %     Weight 12/23/17 0215 87.1 kg (192 lb)     Height 12/23/17 0215 1.778 m (5\' 10" )     Head Circumference --      Peak Flow --      Pain Score --      Pain Loc --      Pain Edu? --      Excl. in GC? --     Constitutional: Obtunded, sleeping soundly but withdraws from painful stimuli. Eyes: Conjunctivae are normal. PERRL but sluggish. Head: Atraumatic. Mouth/Throat: Mucous membranes are moist. Neck: No stridor.  No meningeal signs.  No cervical spine tenderness to palpation. Cardiovascular: Normal rate, regular rhythm. Good peripheral circulation. Grossly normal heart sounds. Respiratory: Normal respiratory effort.  No retractions. Lungs CTAB. Gastrointestinal: Soft and nontender. No distention.  Musculoskeletal: Minimal swelling, redness, and superficial lacerations to multiple fingers of his right hand and the MCP joints of his index and middle fingers, but no gross deformity.  No other injuries identified. Neurologic: Technically the patient has a GCS of 7 (incomprehensible sounds and withdrawing from painful stimuli), but he is currently protecting his airway and withdraws quickly from painful stimuli Skin:  Skin is warm, dry and intact except as described in MSK above  ____________________________________________   LABS (all labs ordered are listed, but only abnormal results are  displayed)  Labs Reviewed  ETHANOL - Abnormal; Notable for the following components:      Result Value   Alcohol, Ethyl (B) 327 (*)    All other components within normal limits  COMPREHENSIVE METABOLIC PANEL - Abnormal; Notable for the following components:   Glucose, Bld 118 (*)    Calcium 8.7 (*)    AST 45 (*)    All other components within normal limits  CBC  URINE DRUG SCREEN, QUALITATIVE ([facility] ONLY)   ____________________________________________  EKG  No EKG ____________________________________________  RADIOLOGY Marylou Mccoy, personally  viewed and evaluated these images (plain radiographs) as part of my medical decision making, as well as reviewing the written report by the radiologist.  ED MD interpretation: No acute fractures nor dislocations  Official radiology report(s): Dg Hand Complete Right  Result Date: 12/23/2017 CLINICAL DATA:  Punched wall, with right hand pain. Initial encounter. EXAM: RIGHT HAND - COMPLETE 3+ VIEW COMPARISON:  None. FINDINGS: There is no evidence of fracture or dislocation. The joint spaces are preserved. The carpal rows are intact, and demonstrate normal alignment. The soft tissues are unremarkable in appearance. IMPRESSION: No evidence of fracture or dislocation. Electronically Signed   By: Roanna Raider M.D.   On: 12/23/2017 03:31    ____________________________________________   PROCEDURES  Critical Care performed: No   Procedure(s) performed:   Procedures   ____________________________________________   INITIAL IMPRESSION / ASSESSMENT AND PLAN / ED COURSE  As part of my medical decision making, I reviewed the following data within the electronic MEDICAL RECORD NUMBER Nursing notes reviewed and incorporated, Labs reviewed  and Old chart reviewed    Differential diagnosis includes, but is not limited to, alcohol intoxication, other drug use, head injury, metabolic abnormality including hypoglycemia, acute fracture to his right hand, etc.  I am evaluating him broadly with lab work and imaging to his right hand.  We will watch him carefully.  While he was asleep he was briefly hypoxemic at 87% so I put him on 4 L of oxygen by nasal cannula.  He did start to vomit so we have turned him on his side.  I am strongly considering intubation but at this point I think that the risks may outweigh the benefits and as long as he remains stable he should be able to metabolize the alcohol without requiring an ICU stay.  He is being observed carefully visually and on the cardiac monitor and pulse  oximeter  Clinical Course as of Dec 23 732  Sat Dec 23, 2017  0340 Alcohol, Ethyl (B): (!!) 327 [CF]  0340 No evidence of fracture or dislocation. DG Hand Complete Right [CF]  0540 Patient remains stable, 98-100% spO2  [CF]  0654 Patient still sleeping but stable.  Transferring ED care to Dr. Shaune Pollack to reassess when awake and clinically sober.  [CF]    Clinical Course User Index [CF] Loleta Rose, MD    ____________________________________________  FINAL CLINICAL IMPRESSION(S) / ED DIAGNOSES  Final diagnoses:  Alcoholic intoxication with complication (HCC)  Contusion of right hand, initial encounter     MEDICATIONS GIVEN DURING THIS VISIT:  Medications  ondansetron (ZOFRAN) injection 4 mg (4 mg Intravenous Given 12/23/17 1610)     ED Discharge Orders    None       Note:  This document was prepared using Dragon voice recognition software and may include unintentional dictation errors.    Loleta Rose, MD 12/23/17 [phone]

## 2017-12-23 NOTE — ED Provider Notes (Signed)
Reevaluation around 830, the patient has been up to the bathroom able to walk normally, interacting appropriately, able to eat something.  He is clinically sober and OK for discharge from the ER.  He has been on the phone to his mom, and declines me talking to her.  He states that he does see a therapist and is on medication for depression and anxiety, but he was not drinking out of depression or anxiety or any suicidal thoughts.  He is not having any suicidal thoughts.  Patient is going to go home by over, he is clinically sober.  I did print the discharge instructions which were prepared by Dr. York CeriseForbach.   Governor RooksLord, Erikson Danzy, MD 12/23/17 [phone]

## 2017-12-23 NOTE — ED Triage Notes (Signed)
Pt arrives via [facility] with c/o being drunk. Pt is sleeping soundly at this time.

## 2017-12-23 NOTE — ED Notes (Signed)
Pt is noted to have reddened area to right hand where per EMS, he punched a wall. Pt is also covered in vomit at this time.

## 2017-12-23 NOTE — ED Notes (Signed)
Selena BattenCody Investment banker, corporate(Brother) [phone]

## 2017-12-23 NOTE — ED Notes (Signed)
Pt currently refusing to keep blood pressure cuff and O2 sensor on bc he is not sure why he is still here. Will continue to round on pt at minimum every 30 minutes.

## 2019-09-15 IMAGING — DX DG HAND COMPLETE 3+V*R*
3 series · 3 of 3 positions shown · non-contrast
Comparison: None.

CLINICAL DATA: Punched wall, with right hand pain. Initial
encounter.

EXAM:
RIGHT HAND - COMPLETE 3+ VIEW

[hand ap]
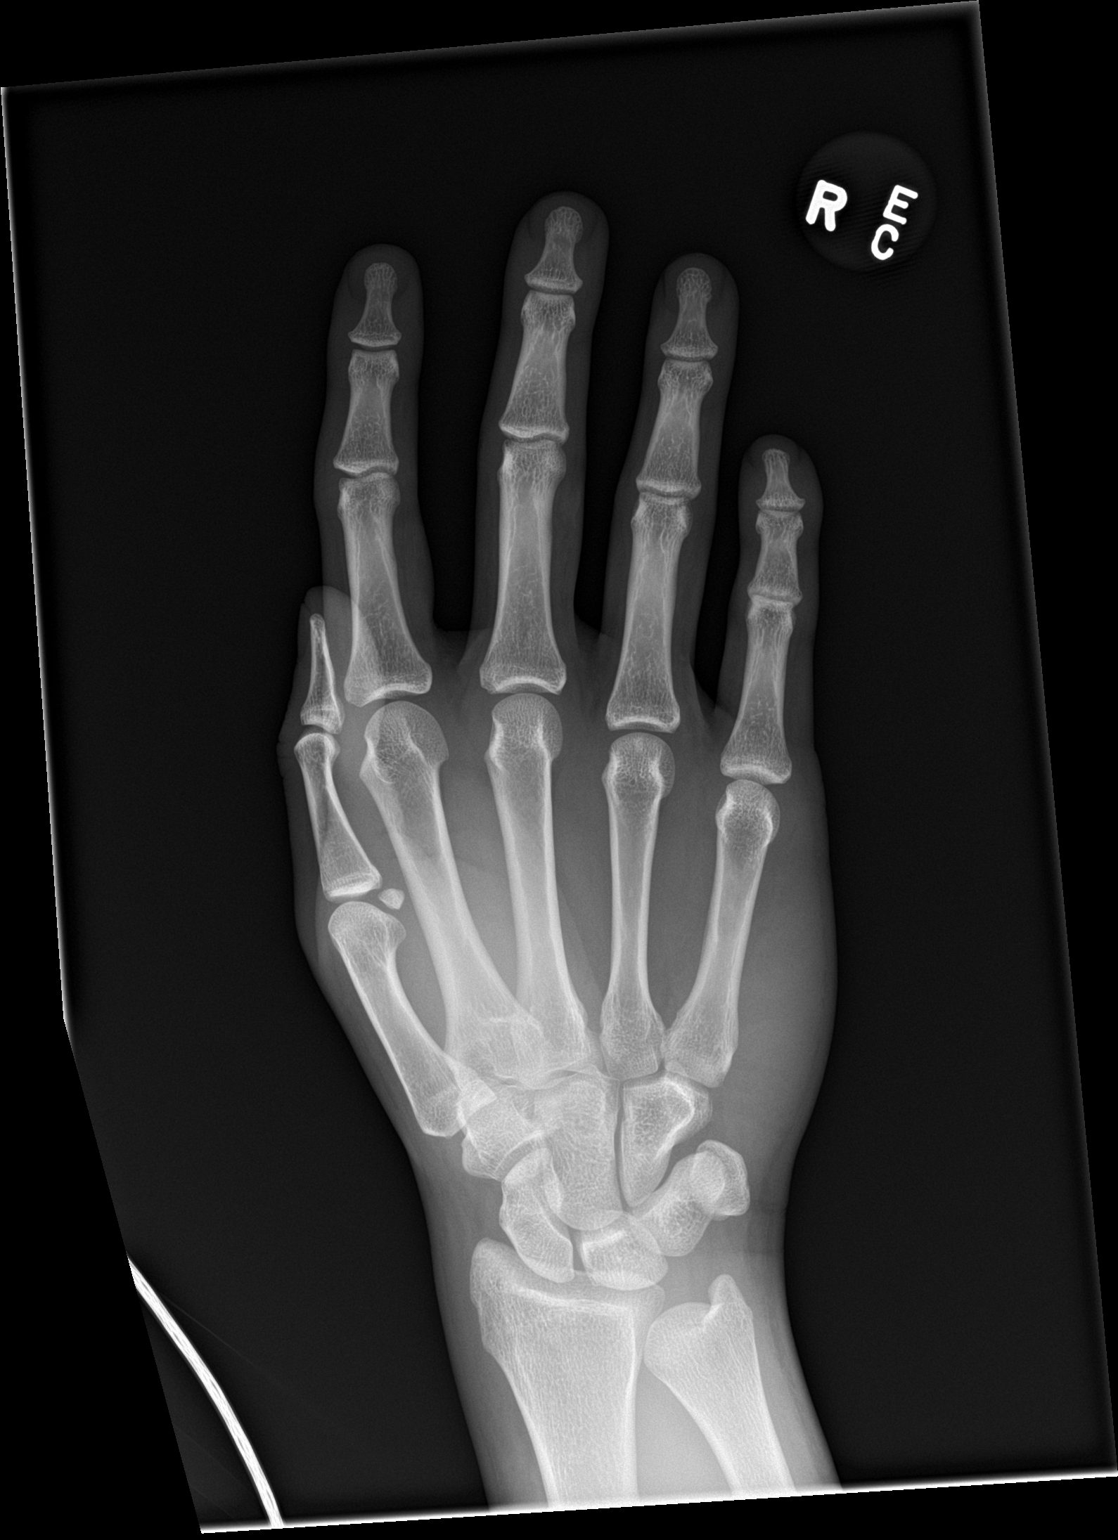

[hand obl]
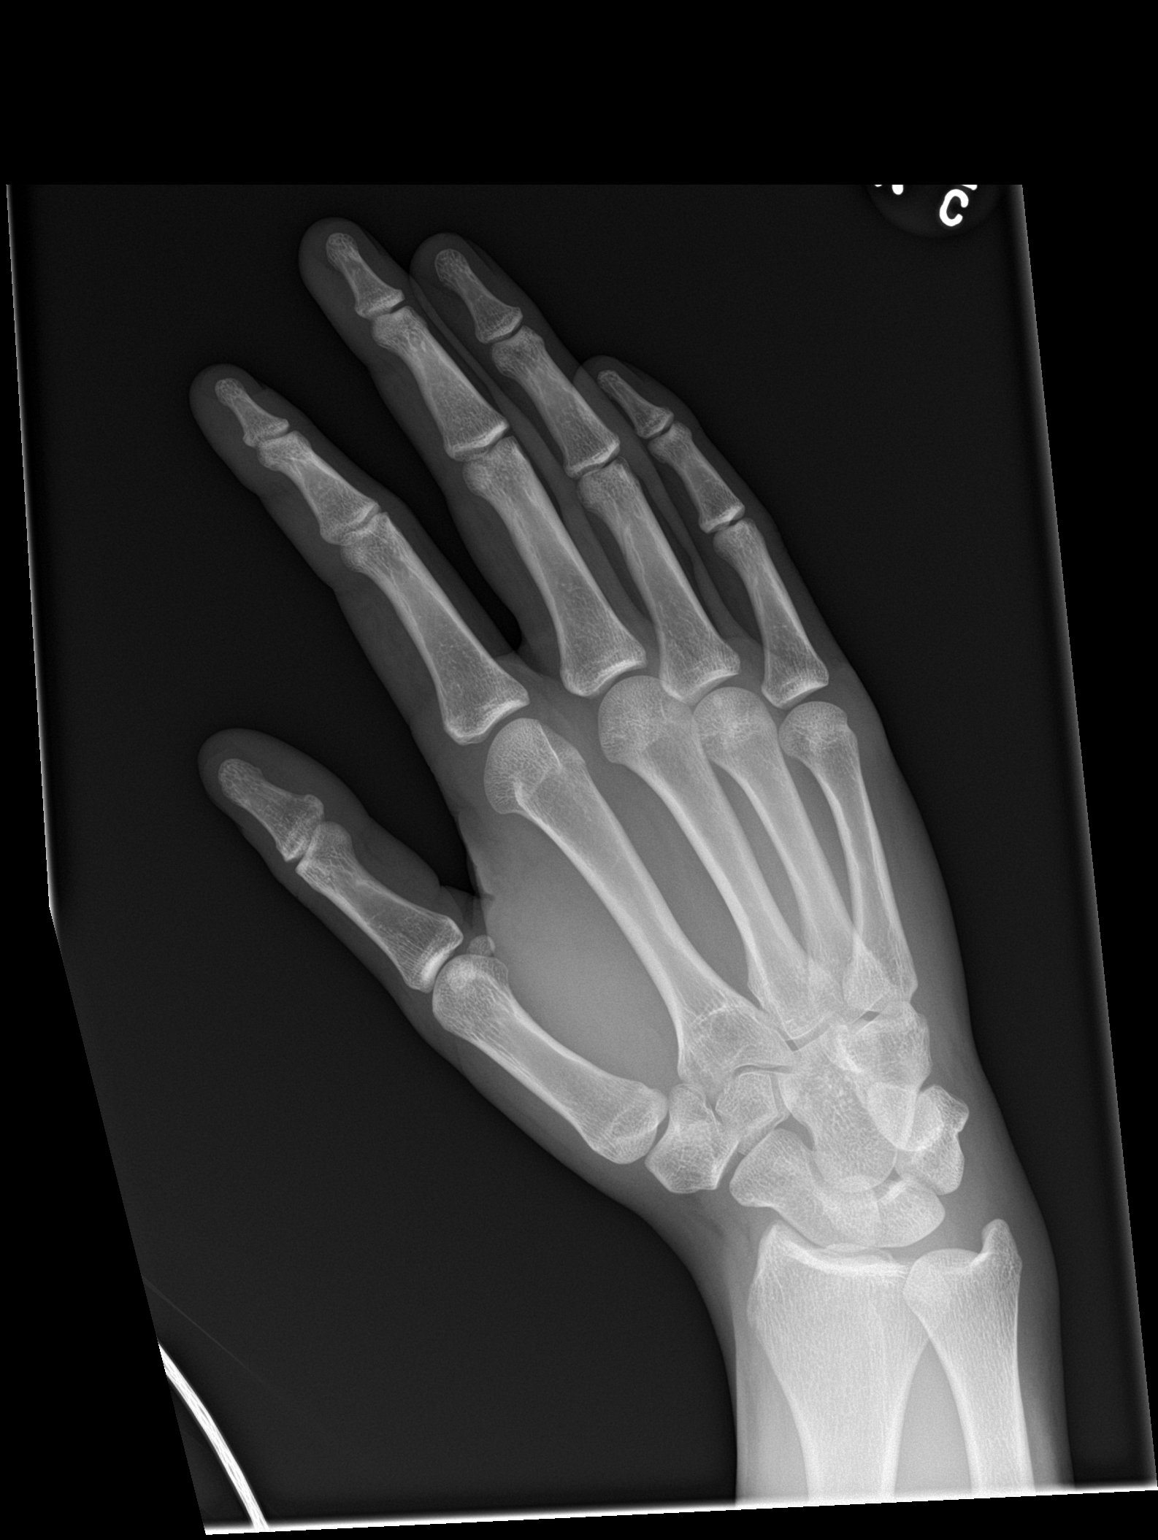

[hand lat]
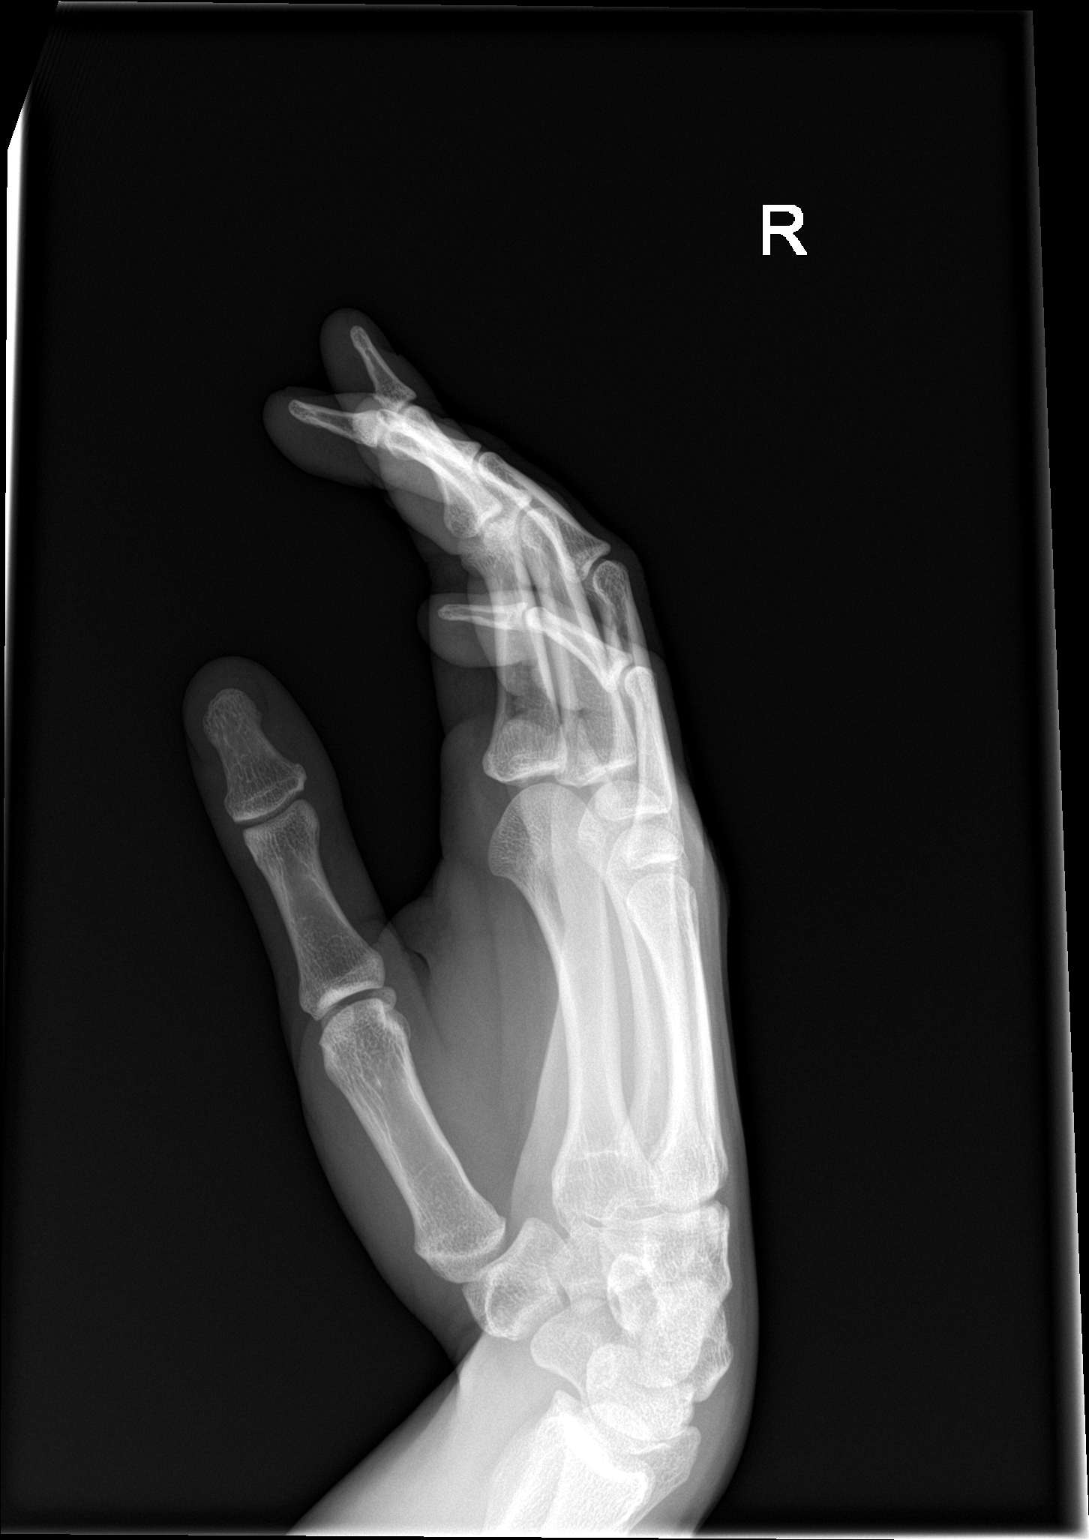

[3 of 3 positions shown; findings below may reference images not displayed]

FINDINGS: There is no evidence of fracture or dislocation. The joint spaces
are preserved. The carpal rows are intact, and demonstrate normal
alignment.

The soft tissues are unremarkable in appearance.
IMPRESSION: No evidence of fracture or dislocation.
